# Patient Record
Sex: Female | Born: 1940 | Race: Black or African American | Hispanic: No | Smoking: Never smoker
Health system: Southern US, Community
[De-identification: ages and names within clinical notes are randomized; demographics above are authoritative.]

---

## 2001-04-02 ENCOUNTER — Emergency Department (HOSPITAL_COMMUNITY): Admission: EM | Admit: 2001-04-02 | Discharge: 2001-04-02 | Payer: Self-pay | Admitting: Emergency Medicine

## 2001-04-02 ENCOUNTER — Encounter: Payer: Self-pay | Admitting: Emergency Medicine

## 2003-10-01 ENCOUNTER — Emergency Department (HOSPITAL_COMMUNITY): Admission: AD | Admit: 2003-10-01 | Discharge: 2003-10-01 | Payer: Self-pay | Admitting: Family Medicine

## 2004-01-02 ENCOUNTER — Emergency Department (HOSPITAL_COMMUNITY): Admission: EM | Admit: 2004-01-02 | Discharge: 2004-01-02 | Payer: Self-pay | Admitting: Family Medicine

## 2007-02-22 ENCOUNTER — Other Ambulatory Visit: Admission: RE | Admit: 2007-02-22 | Discharge: 2007-02-22 | Payer: Self-pay | Admitting: Family Medicine

## 2007-02-27 ENCOUNTER — Encounter: Admission: RE | Admit: 2007-02-27 | Discharge: 2007-02-27 | Payer: Self-pay | Admitting: Family Medicine

## 2007-07-21 ENCOUNTER — Emergency Department (HOSPITAL_COMMUNITY): Admission: EM | Admit: 2007-07-21 | Discharge: 2007-07-21 | Payer: Self-pay | Admitting: Emergency Medicine

## 2008-06-25 ENCOUNTER — Emergency Department (HOSPITAL_COMMUNITY): Admission: EM | Admit: 2008-06-25 | Discharge: 2008-06-25 | Payer: Self-pay | Admitting: Emergency Medicine

## 2011-03-17 ENCOUNTER — Other Ambulatory Visit (HOSPITAL_COMMUNITY)
Admission: RE | Admit: 2011-03-17 | Discharge: 2011-03-17 | Disposition: A | Discharge status: Home / Self Care | Payer: Medicare Other | Source: Ambulatory Visit | Attending: Family Medicine | Admitting: Family Medicine

## 2011-03-17 DIAGNOSIS — Z1159 Encounter for screening for other viral diseases: Secondary | ICD-10-CM | POA: Insufficient documentation

## 2011-03-17 DIAGNOSIS — Z124 Encounter for screening for malignant neoplasm of cervix: Secondary | ICD-10-CM | POA: Insufficient documentation

## 2011-09-09 ENCOUNTER — Inpatient Hospital Stay (INDEPENDENT_AMBULATORY_CARE_PROVIDER_SITE_OTHER)
Admission: RE | Admit: 2011-09-09 | Discharge: 2011-09-09 | Disposition: A | Discharge status: Home / Self Care | Payer: Medicare Other | Source: Ambulatory Visit | Attending: Emergency Medicine | Admitting: Emergency Medicine

## 2011-09-09 ENCOUNTER — Ambulatory Visit (INDEPENDENT_AMBULATORY_CARE_PROVIDER_SITE_OTHER): Payer: Medicare Other

## 2011-09-09 DIAGNOSIS — S82009A Unspecified fracture of unspecified patella, initial encounter for closed fracture: Secondary | ICD-10-CM

## 2012-06-29 ENCOUNTER — Encounter (HOSPITAL_COMMUNITY): Payer: Self-pay | Admitting: *Deleted

## 2012-06-29 ENCOUNTER — Emergency Department (INDEPENDENT_AMBULATORY_CARE_PROVIDER_SITE_OTHER)
Admission: EM | Admit: 2012-06-29 | Discharge: 2012-06-29 | Disposition: A | Discharge status: Home / Self Care | Payer: Medicare Other | Source: Home / Self Care | Attending: Emergency Medicine | Admitting: Emergency Medicine

## 2012-06-29 ENCOUNTER — Emergency Department (INDEPENDENT_AMBULATORY_CARE_PROVIDER_SITE_OTHER): Payer: Medicare Other

## 2012-06-29 DIAGNOSIS — T148XXA Other injury of unspecified body region, initial encounter: Secondary | ICD-10-CM

## 2012-06-29 NOTE — ED Provider Notes (Signed)
Chief Complaint  Patient presents with  . Fall    History of Present Illness:   The patient is a 70 year old female who fell 3 days ago. She was sitting on chair on a patio at home, she reached over and the chair tipped over, she landed on her left side on the concrete of the patio. She did not hit her head and there was no loss of consciousness. Ever since then she's had pain in her entire left arm but most intense over the forearm area and wrist. She able to move all her joints well without hardly any pain. She denies any numbness or tingling. There is no swelling, bruising, or deformity.  Review of Systems:  Other than noted above, the patient denies any of the following symptoms: Systemic:  No fevers, chills, sweats, or aches.  No fatigue or tiredness. Musculoskeletal:  No joint pain, arthritis, bursitis, swelling, back pain, or neck pain. Neurological:  No muscular weakness, paresthesias, headache, or trouble with speech or coordination.  No dizziness.   PMFSH:  Past medical history, family history, social history, meds, and allergies were reviewed.  Physical Exam:   Vital signs:  BP 127/67  Pulse 74  Temp 98 F (36.7 C) (Oral)  Resp 16  SpO2 100% Gen:  Alert and oriented times 3.  In no distress. Musculoskeletal: She is moving all her joints well and does not appear to be in any discomfort or distress. There is no swelling, bruising, or deformity. There is slight pain to palpation over the belly of the biceps and in the mid forearm area extending onto the wrist and the thumb and pendulous she able to move all his joints through full range of motion without any apparent pain. Otherwise, all joints had a full a ROM with no swelling, bruising or deformity.  No edema, pulses full. Extremities were warm and pink.  Capillary refill was brisk.  Skin:  Clear, warm and dry.  No rash. Neuro:  Alert and oriented times 3.  Muscle strength was normal.  Sensation was intact to light touch.    Radiology:  Dg Forearm Left  06/29/2012  *RADIOLOGY REPORT*  Clinical Data: Fall, left arm pain  LEFT FOREARM - 2 VIEW  Comparison: None.  Findings: No fracture or dislocation is seen.  Degenerative changes at the wrist, possibly reflecting sequela of remote prior trauma.  The visualized soft tissues are unremarkable.  IMPRESSION: No fracture or dislocation is seen.  Degenerative changes at the wrist, possibly reflecting sequela of remote prior trauma.  Original Report Authenticated By: Charline Bills, M.D.   Course in Urgent Care Center:   An Ace wrap was applied.  Assessment:  The encounter diagnosis was Contusion.  Plan:   1.  The following meds were prescribed:   New Prescriptions   No medications on file   2.  The patient was instructed in symptomatic care, including rest and activity, elevation, application of ice and compression.  Appropriate handouts were given. 3.  The patient was told to return if becoming worse in any way, if no better in 3 or 4 days, and given some red flag symptoms that would indicate earlier return.   4.  The patient was told to follow up here if no better in 2 weeks. Suggested Tylenol for the pain.   Reuben Likes, MD 06/29/12 [phone]

## 2012-06-29 NOTE — ED Notes (Signed)
Ace   Bandage  Applied l  Arm

## 2012-06-29 NOTE — ED Notes (Signed)
Pt  Reports  She  Felled 2  Days  Ago  She  Tripped  Over      A  Plastic  Chair         She  Reports  Pain l  Wrist  And  Humerus  Area       No  Obvious  Deformity  Noted              She     Is    Awake  As  Well  As  Alert  And  Oriented        In no  Distress

## 2015-03-23 DIAGNOSIS — I10 Essential (primary) hypertension: Secondary | ICD-10-CM | POA: Diagnosis not present

## 2015-05-27 ENCOUNTER — Emergency Department (INDEPENDENT_AMBULATORY_CARE_PROVIDER_SITE_OTHER)
Admission: EM | Admit: 2015-05-27 | Discharge: 2015-05-27 | Disposition: A | Discharge status: Home / Self Care | Payer: Medicare HMO | Source: Home / Self Care | Attending: Family Medicine | Admitting: Family Medicine

## 2015-05-27 ENCOUNTER — Encounter (HOSPITAL_COMMUNITY): Payer: Self-pay | Admitting: Emergency Medicine

## 2015-05-27 ENCOUNTER — Emergency Department (INDEPENDENT_AMBULATORY_CARE_PROVIDER_SITE_OTHER): Payer: Medicare HMO

## 2015-05-27 DIAGNOSIS — S60221A Contusion of right hand, initial encounter: Secondary | ICD-10-CM

## 2015-05-27 DIAGNOSIS — M79641 Pain in right hand: Secondary | ICD-10-CM | POA: Diagnosis not present

## 2015-05-27 DIAGNOSIS — S6991XA Unspecified injury of right wrist, hand and finger(s), initial encounter: Secondary | ICD-10-CM | POA: Diagnosis not present

## 2015-05-27 NOTE — Discharge Instructions (Signed)
advil as needed, soak in warm water for soreness and swelling.

## 2015-05-27 NOTE — ED Provider Notes (Signed)
CSN: [id number]     Arrival date & time 05/27/15  1322 History   First MD Initiated Contact with Patient 05/27/15 1531     Chief Complaint  Patient presents with  . Hand Pain   (Consider location/radiation/quality/duration/timing/severity/associated sxs/prior Treatment) Patient is a 74 y.o. female presenting with hand pain. The history is provided by the patient.  Hand Pain This is a new problem. The current episode started more than 1 week ago (crepe myrtle tree limb fell onto right hand 1 wk ago , still sore and swollen.). The problem has been gradually worsening.    History reviewed. No pertinent past medical history. History reviewed. No pertinent past surgical history. No family history on file. History  Substance Use Topics  . Smoking status: Never Smoker   . Smokeless tobacco: Not on file  . Alcohol Use: No   OB History    No data available     Review of Systems  Constitutional: Negative.   Musculoskeletal: Positive for joint swelling. Negative for gait problem.  Skin: Negative for wound.    Allergies  Review of patient's allergies indicates no known allergies.  Home Medications   Prior to Admission medications   Not on File   BP 128/82 mmHg  Pulse 57  Temp(Src) 98.5 F (36.9 C) (Oral)  Resp 16  SpO2 98% Physical Exam  Constitutional: She is oriented to person, place, and time. She appears well-developed and well-nourished.  Musculoskeletal: She exhibits tenderness.       Right hand: She exhibits tenderness. She exhibits normal capillary refill, no deformity and no laceration. Normal sensation noted.       Hands: Neurological: She is alert and oriented to person, place, and time.  Skin: Skin is warm and dry.  Nursing note and vitals reviewed.   ED Course  Procedures (including critical care time) Labs Review Labs Reviewed - No data to display  Imaging Review Dg Hand Complete Right  05/27/2015   CLINICAL DATA:  Tree limb fell on hand 1 week ago.  Third and fourth metacarpal pain.  EXAM: RIGHT HAND - COMPLETE 3+ VIEW  COMPARISON:  01/02/2004  FINDINGS: No acute bony abnormality. Specifically, no fracture, subluxation, or dislocation. Soft tissues are intact. Soft tissues are intact.  IMPRESSION: No bony abnormality.   Electronically Signed   By: Charlett NoseKevin  Dover M.D.   On: 05/27/2015 15:57    X-rays reviewed and report per radiologist.  MDM   1. Contusion, hand, right, initial encounter       Linna HoffJames D Kindl, MD 05/27/15 [phone]

## 2015-08-18 IMAGING — DX DG HAND COMPLETE 3+V*R*
3 series · 3 of 3 positions shown · non-contrast
Comparison: 01/02/2004

CLINICAL DATA: Tree limb fell on hand 1 week ago. Third and fourth
metacarpal pain.

EXAM:
RIGHT HAND - COMPLETE 3+ VIEW

[hand pa]
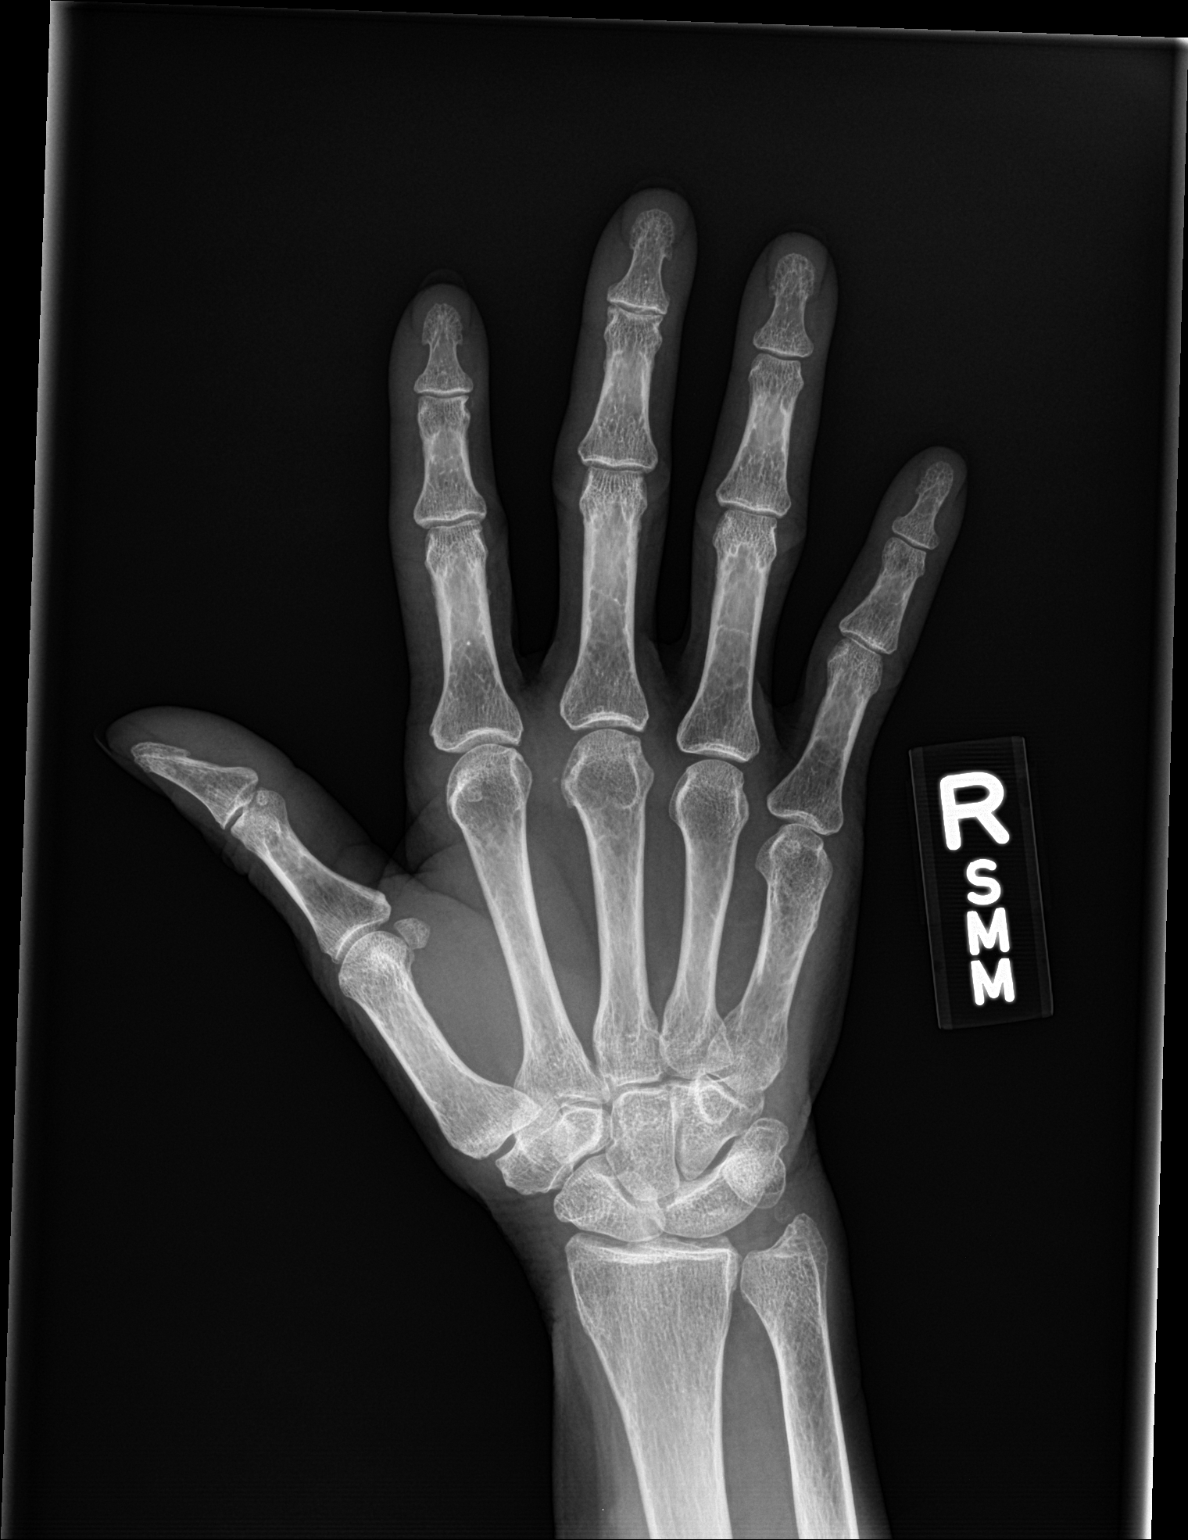

[hand obl]
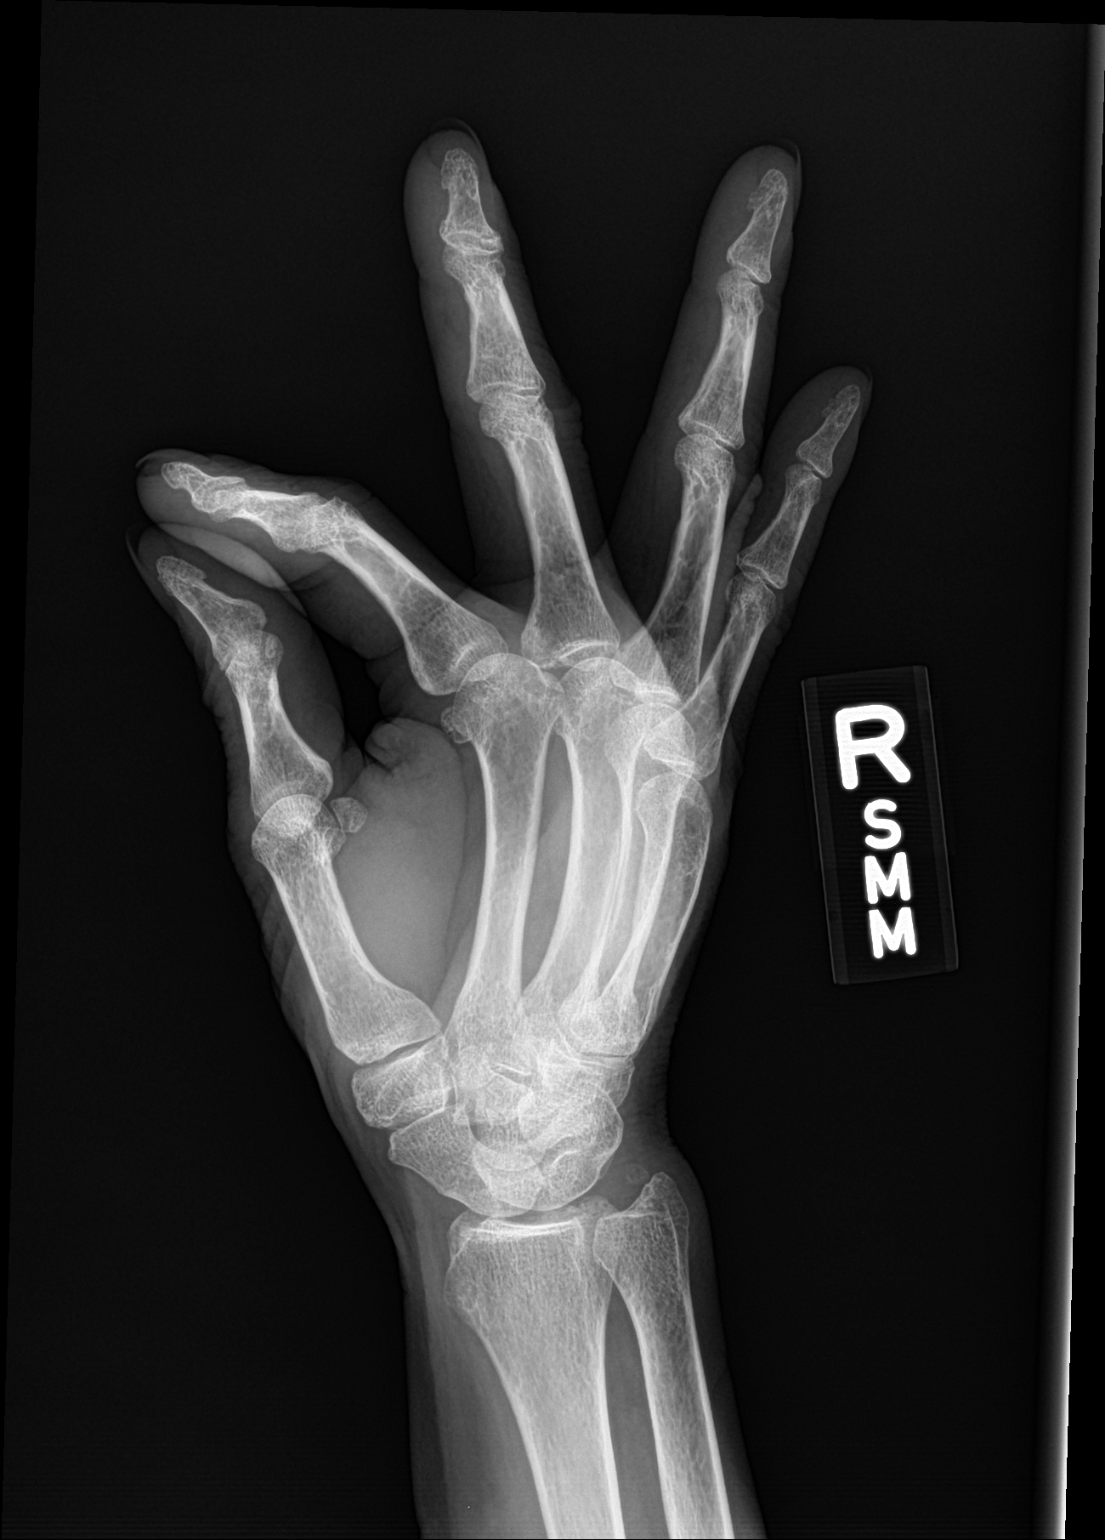

[hand lat]
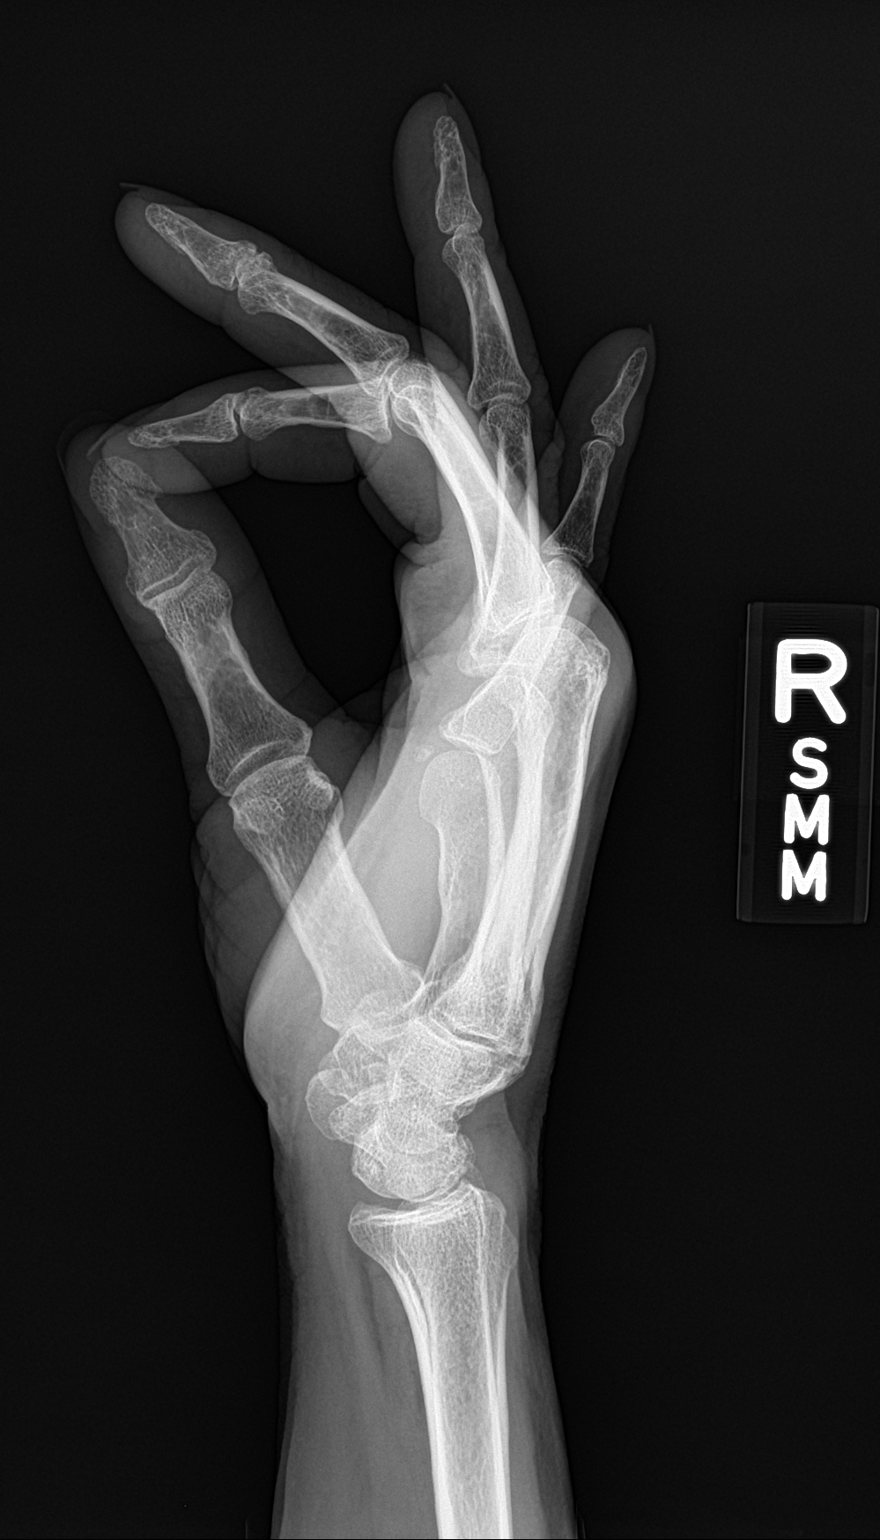

[3 of 3 positions shown; findings below may reference images not displayed]

FINDINGS: No acute bony abnormality. Specifically, no fracture, subluxation,
or dislocation. Soft tissues are intact. Soft tissues are intact.
IMPRESSION: No bony abnormality.

## 2015-12-09 DIAGNOSIS — E782 Mixed hyperlipidemia: Secondary | ICD-10-CM | POA: Diagnosis not present

## 2016-07-11 DIAGNOSIS — G5681 Other specified mononeuropathies of right upper limb: Secondary | ICD-10-CM | POA: Diagnosis not present

## 2016-07-11 DIAGNOSIS — R200 Anesthesia of skin: Secondary | ICD-10-CM | POA: Diagnosis not present

## 2016-07-11 DIAGNOSIS — G5601 Carpal tunnel syndrome, right upper limb: Secondary | ICD-10-CM | POA: Diagnosis not present

## 2016-07-11 DIAGNOSIS — G5691 Unspecified mononeuropathy of right upper limb: Secondary | ICD-10-CM | POA: Diagnosis not present

## 2016-07-26 DIAGNOSIS — G5600 Carpal tunnel syndrome, unspecified upper limb: Secondary | ICD-10-CM | POA: Diagnosis not present

## 2016-08-02 DIAGNOSIS — M8589 Other specified disorders of bone density and structure, multiple sites: Secondary | ICD-10-CM | POA: Diagnosis not present

## 2016-08-02 DIAGNOSIS — Z803 Family history of malignant neoplasm of breast: Secondary | ICD-10-CM | POA: Diagnosis not present

## 2016-08-02 DIAGNOSIS — Z1231 Encounter for screening mammogram for malignant neoplasm of breast: Secondary | ICD-10-CM | POA: Diagnosis not present

## 2016-08-18 DIAGNOSIS — G5602 Carpal tunnel syndrome, left upper limb: Secondary | ICD-10-CM | POA: Diagnosis not present

## 2016-08-18 DIAGNOSIS — G5681 Other specified mononeuropathies of right upper limb: Secondary | ICD-10-CM | POA: Diagnosis not present

## 2016-08-18 DIAGNOSIS — R200 Anesthesia of skin: Secondary | ICD-10-CM | POA: Diagnosis not present

## 2016-08-18 DIAGNOSIS — G5601 Carpal tunnel syndrome, right upper limb: Secondary | ICD-10-CM | POA: Diagnosis not present

## 2016-08-18 DIAGNOSIS — G5691 Unspecified mononeuropathy of right upper limb: Secondary | ICD-10-CM | POA: Diagnosis not present

## 2016-09-16 DIAGNOSIS — G5602 Carpal tunnel syndrome, left upper limb: Secondary | ICD-10-CM | POA: Diagnosis not present

## 2016-09-22 DIAGNOSIS — G5602 Carpal tunnel syndrome, left upper limb: Secondary | ICD-10-CM | POA: Diagnosis not present

## 2016-09-22 DIAGNOSIS — R200 Anesthesia of skin: Secondary | ICD-10-CM | POA: Diagnosis not present

## 2016-09-22 DIAGNOSIS — G5691 Unspecified mononeuropathy of right upper limb: Secondary | ICD-10-CM | POA: Diagnosis not present

## 2016-09-22 DIAGNOSIS — R202 Paresthesia of skin: Secondary | ICD-10-CM | POA: Diagnosis not present

## 2017-05-01 ENCOUNTER — Encounter (HOSPITAL_COMMUNITY): Payer: Self-pay | Admitting: Emergency Medicine

## 2017-05-01 ENCOUNTER — Ambulatory Visit (HOSPITAL_COMMUNITY)
Admission: EM | Admit: 2017-05-01 | Discharge: 2017-05-01 | Disposition: A | Discharge status: Home / Self Care | Payer: Medicare HMO | Attending: Internal Medicine | Admitting: Internal Medicine

## 2017-05-01 ENCOUNTER — Ambulatory Visit (INDEPENDENT_AMBULATORY_CARE_PROVIDER_SITE_OTHER): Payer: Medicare HMO

## 2017-05-01 DIAGNOSIS — M25561 Pain in right knee: Secondary | ICD-10-CM

## 2017-05-01 DIAGNOSIS — W19XXXA Unspecified fall, initial encounter: Secondary | ICD-10-CM

## 2017-05-01 DIAGNOSIS — S8991XA Unspecified injury of right lower leg, initial encounter: Secondary | ICD-10-CM

## 2017-05-01 MED ORDER — HYDROCODONE-ACETAMINOPHEN 5-325 MG PO TABS: 1.0000 | ORAL_TABLET | ORAL | 0 refills | Status: AC | PRN

## 2017-05-01 NOTE — ED Triage Notes (Signed)
[facility] a [facility] of right knee pain secondary to a fall 1 week ago.

## 2017-05-01 NOTE — Discharge Instructions (Signed)
Your Xray shows possible fracture to right patella ( knee cap) you are being referred to orthopedic surgery Dr. Linna CapriceSwinteck.  Please call his office today or tomorrow for a visit in a week.

## 2017-05-01 NOTE — ED Provider Notes (Signed)
CSN: [id number]     Arrival date & time 05/01/17  1222 History   None    Chief Complaint  Patient presents with  . Knee Pain   (Consider location/radiation/quality/duration/timing/severity/associated sxs/prior Treatment) Patient fell on right knee a week ago and she is having difficulty walking and is having to use a crutch she borrowed from family.     The history is provided by the patient.  Knee Pain  Location:  Knee Time since incident:  1 week Injury: yes   Mechanism of injury: fall   Fall:    Impact surface:  Primary school teacherConcrete   Point of impact:  Knees   Entrapped after fall: no   Knee location:  R knee Pain details:    Quality:  Aching   Radiates to:  Does not radiate   Severity:  Moderate   Duration:  1 week   Timing:  Constant Chronicity:  New Dislocation: no   Foreign body present:  No foreign bodies   History reviewed. No pertinent past medical history. History reviewed. No pertinent surgical history. History reviewed. No pertinent family history. Social History  Substance Use Topics  . Smoking status: Never Smoker  . Smokeless tobacco: Never Used  . Alcohol use No   OB History    No data available     Review of Systems  Constitutional: Negative.   HENT: Negative.   Eyes: Negative.   Respiratory: Negative.   Cardiovascular: Negative.   Gastrointestinal: Negative.   Endocrine: Negative.   Genitourinary: Negative.   Musculoskeletal: Positive for arthralgias.  Allergic/Immunologic: Negative.   Neurological: Negative.   Hematological: Negative.   Psychiatric/Behavioral: Negative.     Allergies  Patient has no known allergies.  Home Medications   Prior to Admission medications   Medication Sig Start Date End Date Taking? Authorizing Provider  HYDROcodone-acetaminophen (NORCO/VICODIN) 5-325 MG tablet Take 1 tablet by mouth every 4 (four) hours as needed. 05/01/17   Deatra Canterxford, Berenize Gatlin J, FNP   Meds Ordered and Administered this Visit  Medications - No  data to display  BP (!) 141/73 (BP Location: Right Arm)   Pulse 72   Temp 97.8 F (36.6 C) (Oral)   Resp 18   SpO2 99%  No data found.   Physical Exam  Constitutional: She appears well-developed and well-nourished.  HENT:  Head: Normocephalic and atraumatic.  Eyes: Conjunctivae and EOM are normal. Pupils are equal, round, and reactive to light.  Neck: Normal range of motion. Neck supple.  Cardiovascular: Normal rate, regular rhythm and normal heart sounds.   Pulmonary/Chest: Effort normal and breath sounds normal.  Musculoskeletal: She exhibits tenderness.  TTP right patella.  Bruising over right patella.  Decreased ROM with flexion of right knee.  Nursing note and vitals reviewed.   Urgent Care Course     Procedures (including critical care time)  Labs Review Labs Reviewed - No data to display  Imaging Review Dg Knee Ap/lat W/sunrise Right  Result Date: 05/01/2017 CLINICAL DATA:  Right knee injury.  Right patella pain. EXAM: RIGHT KNEE 3 VIEWS COMPARISON:  None. FINDINGS: There is prepatellar right knee soft tissue swelling. Small suprapatellar right knee joint effusion. There is curvilinear lucency in the medial right patella with the suggestion of associated cortical discontinuity, cannot exclude a nondisplaced medial right patella fracture. No additional potential fracture. No dislocation. No suspicious focal osseous lesion. No significant arthropathy. Minimal meniscal chondrocalcinosis. No radiopaque foreign body. IMPRESSION: 1. Suspicion of nondisplaced medial right patella fracture. Consider further evaluation with  CT or MRI of the right knee. 2. Prepatellar right knee soft tissue swelling. 3. Small suprapatellar right knee joint effusion. These results will be called to the ordering clinician or representative by the Radiology Department at the imaging location. Electronically Signed   By: Delbert Phenix M.D.   On: 05/01/2017 13:47     Visual Acuity Review  Right Eye  Distance:   Left Eye Distance:   Bilateral Distance:    Right Eye Near:   Left Eye Near:    Bilateral Near:         MDM   1. Acute pain of right knee   2. Fall, initial encounter   3. Injury of right knee, initial encounter    Right knee imobilizer Follow up with Ortho Dr. Veda Canning office in one week.  Norco 5/325 one po q 6 hours prn pain #12      Deatra Canter, FNP 05/01/17 1522

## 2017-07-18 DIAGNOSIS — M25561 Pain in right knee: Secondary | ICD-10-CM | POA: Diagnosis not present

## 2017-07-18 DIAGNOSIS — M7741 Metatarsalgia, right foot: Secondary | ICD-10-CM | POA: Diagnosis not present

## 2017-07-23 IMAGING — DX DG KNEE AP/LAT W/ SUNRISE*R*
3 series · 3 of 3 positions shown · non-contrast
Comparison: None.

CLINICAL DATA: Right knee injury.  Right patella pain.

EXAM:
RIGHT KNEE 3 VIEWS

[knee ap]
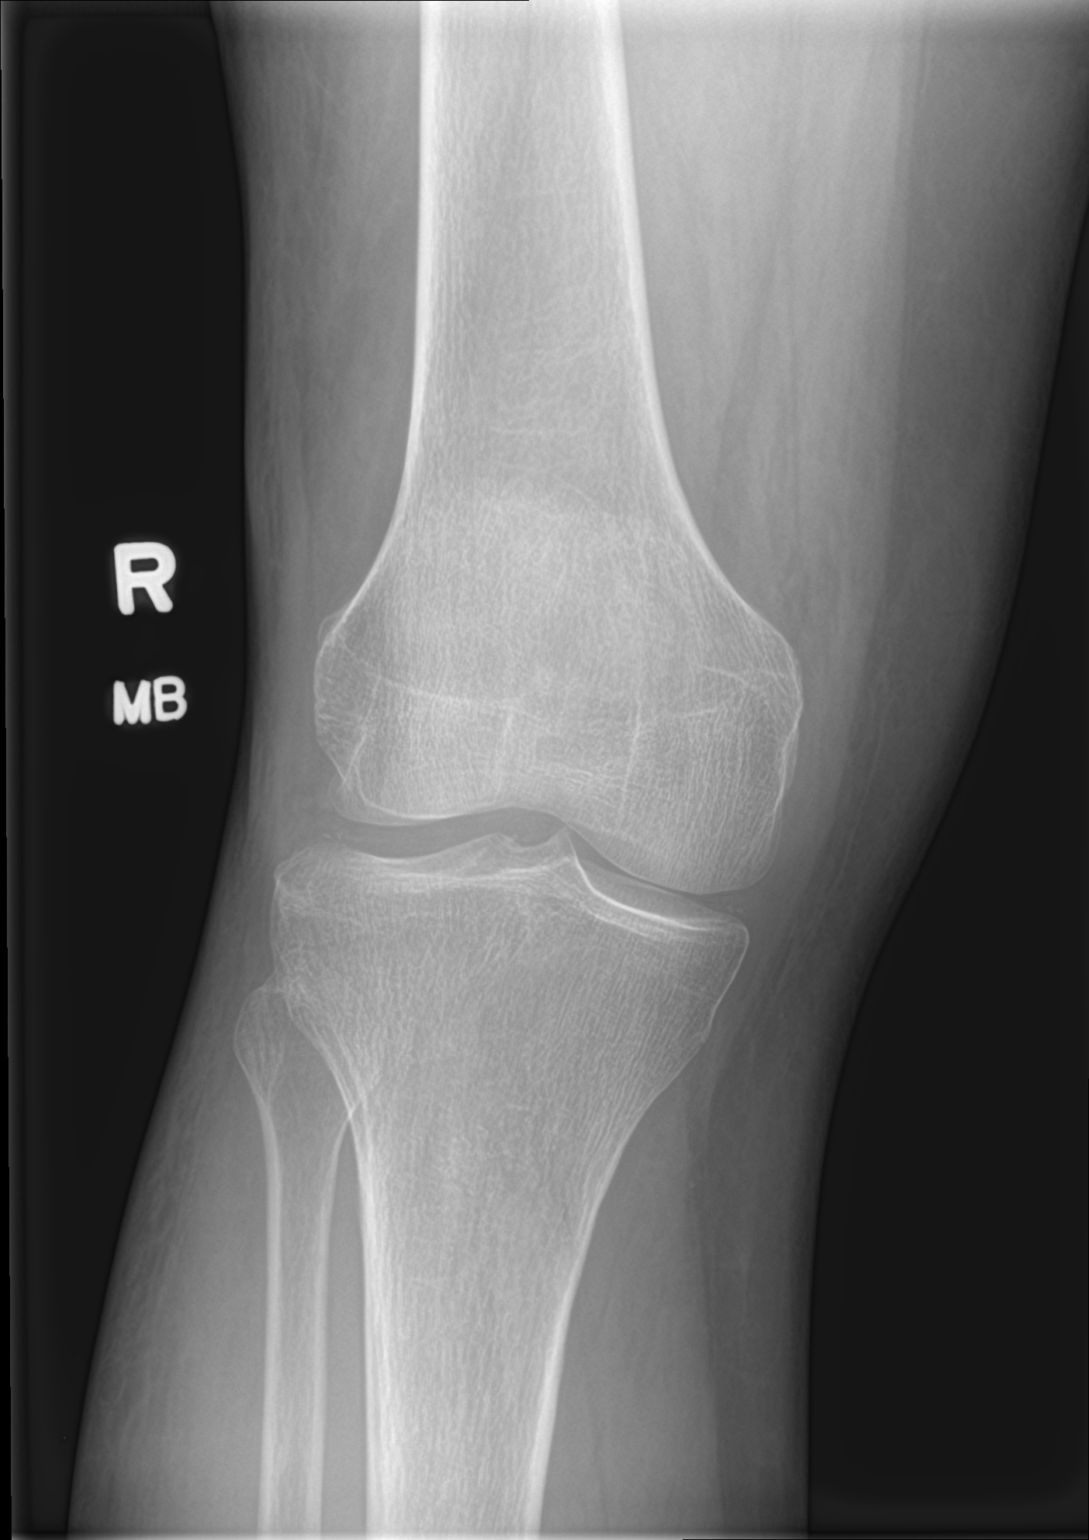

[knee lat]
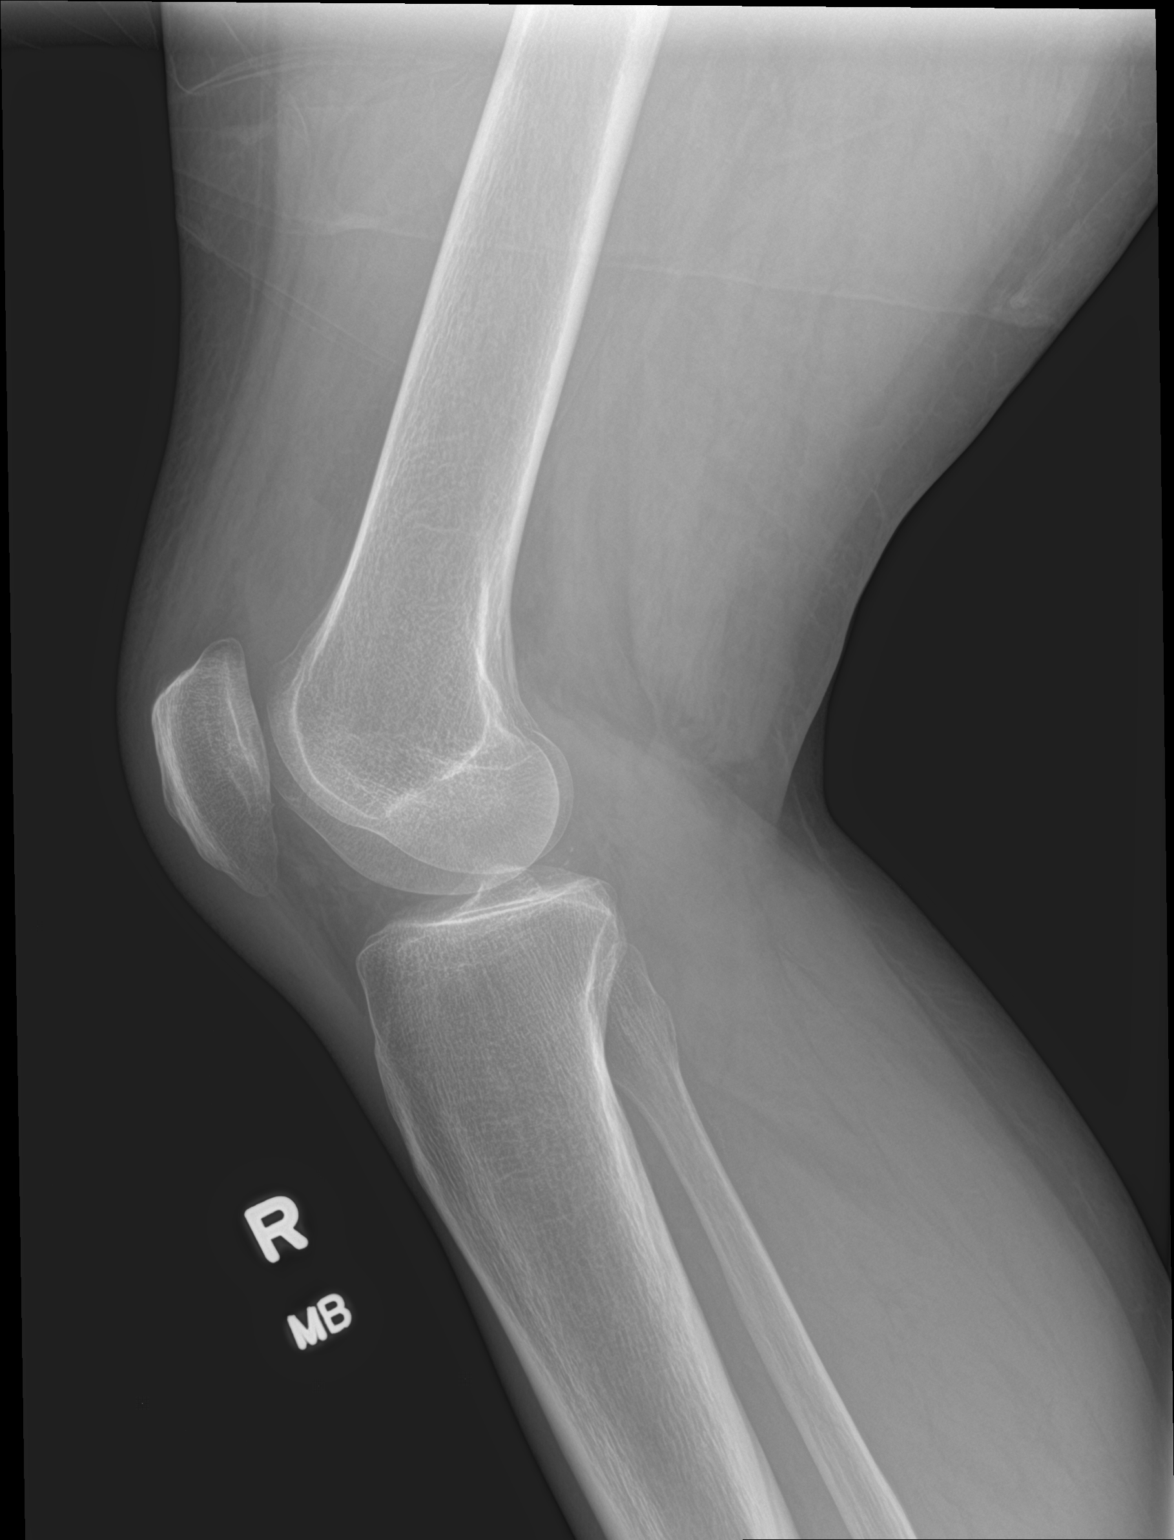

[knee sunrise]
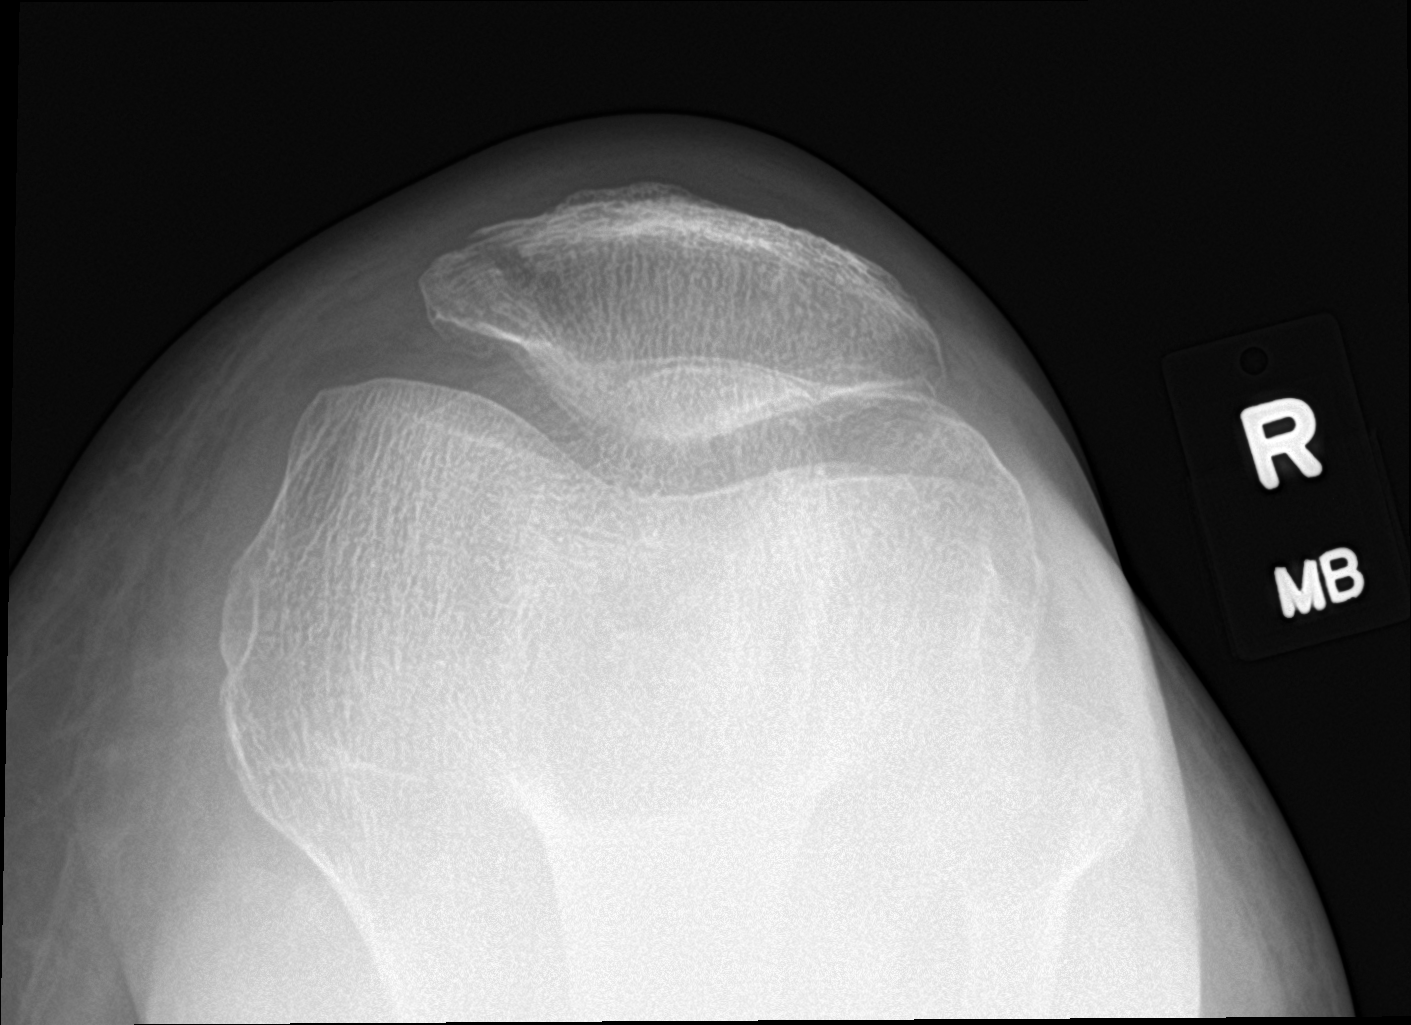

[3 of 3 positions shown; findings below may reference images not displayed]

FINDINGS: There is prepatellar right knee soft tissue swelling. Small
suprapatellar right knee joint effusion. There is curvilinear
lucency in the medial right patella with the suggestion of
associated cortical discontinuity, cannot exclude a nondisplaced
medial right patella fracture. No additional potential fracture. No
dislocation. No suspicious focal osseous lesion. No significant
arthropathy. Minimal meniscal chondrocalcinosis. No radiopaque
foreign body.
IMPRESSION: 1. Suspicion of nondisplaced medial right patella fracture. Consider
further evaluation with CT or MRI of the right knee.
2. Prepatellar right knee soft tissue swelling.
3. Small suprapatellar right knee joint effusion.

These results will be called to the ordering clinician or
representative by the [HOSPITAL] at the imaging location.

## 2017-08-01 ENCOUNTER — Other Ambulatory Visit: Payer: Self-pay | Admitting: Orthopedic Surgery

## 2017-08-01 DIAGNOSIS — M25561 Pain in right knee: Principal | ICD-10-CM

## 2017-08-01 DIAGNOSIS — G8929 Other chronic pain: Secondary | ICD-10-CM

## 2017-08-17 ENCOUNTER — Other Ambulatory Visit: Payer: Medicare HMO

## 2017-09-02 ENCOUNTER — Other Ambulatory Visit: Payer: Medicare HMO

## 2017-09-02 ENCOUNTER — Inpatient Hospital Stay: Admission: RE | Admit: 2017-09-02 | Payer: Medicare HMO | Source: Ambulatory Visit

## 2018-05-28 DIAGNOSIS — E782 Mixed hyperlipidemia: Secondary | ICD-10-CM | POA: Diagnosis not present

## 2018-05-28 DIAGNOSIS — I10 Essential (primary) hypertension: Secondary | ICD-10-CM | POA: Diagnosis not present

## 2019-10-21 DIAGNOSIS — E785 Hyperlipidemia, unspecified: Secondary | ICD-10-CM | POA: Diagnosis not present

## 2019-10-21 DIAGNOSIS — R42 Dizziness and giddiness: Secondary | ICD-10-CM | POA: Diagnosis not present

## 2019-10-21 DIAGNOSIS — Z6826 Body mass index (BMI) 26.0-26.9, adult: Secondary | ICD-10-CM | POA: Diagnosis not present

## 2019-10-21 DIAGNOSIS — I10 Essential (primary) hypertension: Secondary | ICD-10-CM | POA: Diagnosis not present

## 2019-10-29 DIAGNOSIS — I10 Essential (primary) hypertension: Secondary | ICD-10-CM | POA: Diagnosis not present

## 2019-10-29 DIAGNOSIS — E782 Mixed hyperlipidemia: Secondary | ICD-10-CM | POA: Diagnosis not present

## 2019-10-29 DIAGNOSIS — R42 Dizziness and giddiness: Secondary | ICD-10-CM | POA: Diagnosis not present

## 2020-10-20 DIAGNOSIS — I10 Essential (primary) hypertension: Secondary | ICD-10-CM | POA: Diagnosis not present

## 2020-10-20 DIAGNOSIS — E785 Hyperlipidemia, unspecified: Secondary | ICD-10-CM | POA: Diagnosis not present

## 2020-10-20 DIAGNOSIS — Z23 Encounter for immunization: Secondary | ICD-10-CM | POA: Diagnosis not present

## 2021-03-01 DIAGNOSIS — I10 Essential (primary) hypertension: Secondary | ICD-10-CM | POA: Diagnosis not present

## 2021-03-01 DIAGNOSIS — E785 Hyperlipidemia, unspecified: Secondary | ICD-10-CM | POA: Diagnosis not present

## 2022-01-11 DIAGNOSIS — Z6828 Body mass index (BMI) 28.0-28.9, adult: Secondary | ICD-10-CM | POA: Diagnosis not present

## 2022-01-11 DIAGNOSIS — I10 Essential (primary) hypertension: Secondary | ICD-10-CM | POA: Diagnosis not present

## 2022-01-11 DIAGNOSIS — E785 Hyperlipidemia, unspecified: Secondary | ICD-10-CM | POA: Diagnosis not present

## 2022-05-27 ENCOUNTER — Other Ambulatory Visit: Payer: Self-pay | Admitting: Family Medicine

## 2022-05-27 DIAGNOSIS — Z1231 Encounter for screening mammogram for malignant neoplasm of breast: Secondary | ICD-10-CM

## 2022-06-08 ENCOUNTER — Ambulatory Visit: Payer: Medicare HMO

## 2022-06-24 ENCOUNTER — Ambulatory Visit: Payer: Medicare HMO

## 2022-07-07 ENCOUNTER — Ambulatory Visit: Payer: Medicare HMO

## 2022-07-27 ENCOUNTER — Inpatient Hospital Stay: Admission: RE | Admit: 2022-07-27 | Payer: Medicare HMO | Source: Ambulatory Visit

## 2022-08-17 ENCOUNTER — Ambulatory Visit: Payer: Medicare HMO

## 2022-09-06 DIAGNOSIS — E785 Hyperlipidemia, unspecified: Secondary | ICD-10-CM | POA: Diagnosis not present

## 2022-09-06 DIAGNOSIS — I10 Essential (primary) hypertension: Secondary | ICD-10-CM | POA: Diagnosis not present

## 2022-09-06 DIAGNOSIS — Z6826 Body mass index (BMI) 26.0-26.9, adult: Secondary | ICD-10-CM | POA: Diagnosis not present

## 2022-09-06 DIAGNOSIS — Z0000 Encounter for general adult medical examination without abnormal findings: Secondary | ICD-10-CM | POA: Diagnosis not present

## 2023-05-04 ENCOUNTER — Ambulatory Visit
Admission: RE | Admit: 2023-05-04 | Discharge: 2023-05-04 | Disposition: A | Discharge status: Home / Self Care | Payer: Medicare HMO | Source: Ambulatory Visit | Attending: Family Medicine | Admitting: Family Medicine

## 2023-05-04 ENCOUNTER — Ambulatory Visit: Payer: Medicare HMO

## 2023-05-04 DIAGNOSIS — Z1231 Encounter for screening mammogram for malignant neoplasm of breast: Secondary | ICD-10-CM

## 2023-08-15 DIAGNOSIS — Z6826 Body mass index (BMI) 26.0-26.9, adult: Secondary | ICD-10-CM | POA: Diagnosis not present

## 2023-08-15 DIAGNOSIS — E7800 Pure hypercholesterolemia, unspecified: Secondary | ICD-10-CM | POA: Diagnosis not present

## 2023-08-15 DIAGNOSIS — Z23 Encounter for immunization: Secondary | ICD-10-CM | POA: Diagnosis not present

## 2023-08-15 DIAGNOSIS — I10 Essential (primary) hypertension: Secondary | ICD-10-CM | POA: Diagnosis not present

## 2023-08-15 DIAGNOSIS — E782 Mixed hyperlipidemia: Secondary | ICD-10-CM | POA: Diagnosis not present

## 2024-06-18 DIAGNOSIS — I10 Essential (primary) hypertension: Secondary | ICD-10-CM | POA: Diagnosis not present

## 2024-06-18 DIAGNOSIS — E7800 Pure hypercholesterolemia, unspecified: Secondary | ICD-10-CM | POA: Diagnosis not present

## 2024-09-26 ENCOUNTER — Other Ambulatory Visit: Payer: Self-pay | Admitting: Family Medicine

## 2024-09-26 DIAGNOSIS — Z1231 Encounter for screening mammogram for malignant neoplasm of breast: Secondary | ICD-10-CM

## 2024-10-23 ENCOUNTER — Ambulatory Visit

## 2024-11-20 ENCOUNTER — Ambulatory Visit
